# Patient Record
Sex: Male | Born: 1975 | Race: Black or African American | Hispanic: No | Marital: Married | Smoking: Never smoker
Health system: Southern US, Community
[De-identification: ages and names within clinical notes are randomized; demographics above are authoritative.]

---

## 2008-06-05 ENCOUNTER — Emergency Department (HOSPITAL_COMMUNITY): Admission: EM | Admit: 2008-06-05 | Discharge: 2008-06-05 | Payer: Self-pay | Admitting: Emergency Medicine

## 2010-10-12 ENCOUNTER — Other Ambulatory Visit: Payer: Self-pay | Admitting: Internal Medicine

## 2010-10-12 DIAGNOSIS — R1010 Upper abdominal pain, unspecified: Secondary | ICD-10-CM

## 2010-10-13 ENCOUNTER — Ambulatory Visit
Admission: RE | Admit: 2010-10-13 | Discharge: 2010-10-13 | Disposition: A | Discharge status: Home / Self Care | Payer: Managed Care, Other (non HMO) | Source: Ambulatory Visit | Attending: Internal Medicine | Admitting: Internal Medicine

## 2010-10-13 ENCOUNTER — Other Ambulatory Visit: Payer: Self-pay | Admitting: Internal Medicine

## 2010-10-13 DIAGNOSIS — R1010 Upper abdominal pain, unspecified: Secondary | ICD-10-CM

## 2012-06-24 IMAGING — RF DG UGI W/ HIGH DENSITY W/KUB
19 of 24 series · 19 of 24 positions shown · non-contrast
Comparison: None.

CLINICAL DATA: Upper abdominal pain.  Question gastroesophageal
reflux disease/ulcer.

UPPER GI SERIES WITH KUB
TECHNIQUE: Routine upper GI series was performed with thin and
high density barium.
Fluoroscopy Time: 2.5 minutes

[Series 1: run · 1 of 2 slices shown (1 of 19)]
[im 1/2]
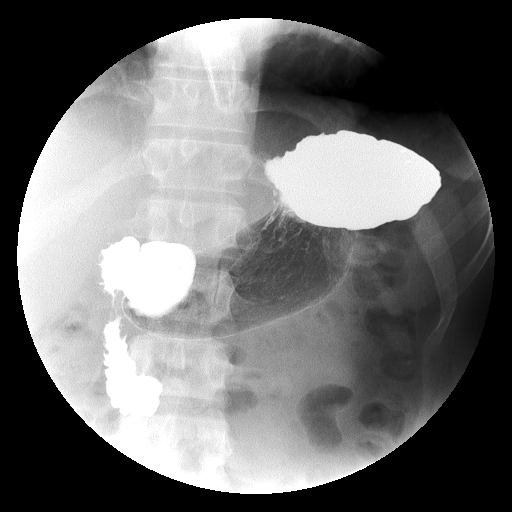

[Series 2: run · 1 of 2 slices shown (2 of 19)]
[im 1/2]
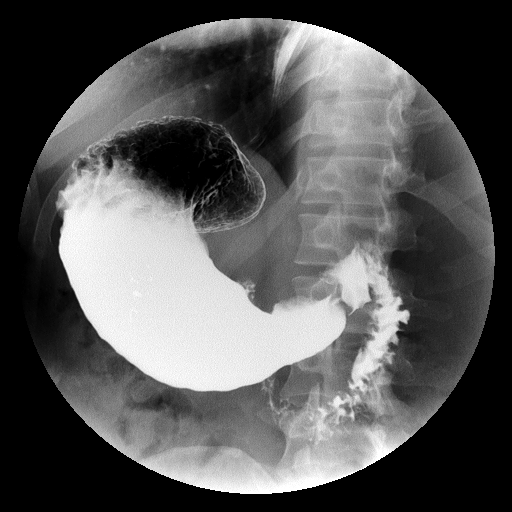

[Series 4: run · 1 of 4 slices shown (3 of 19)]
[im 1/4]
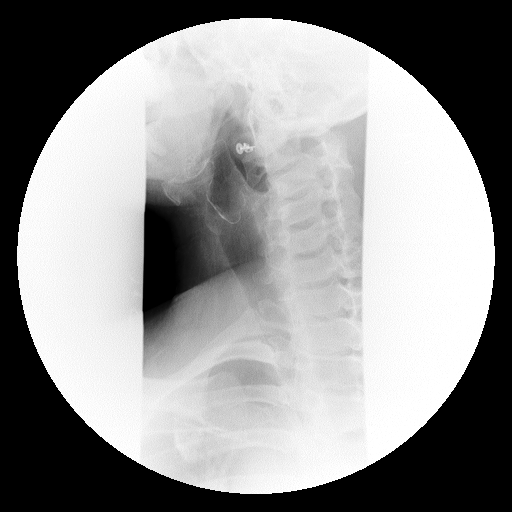

[Series 5: run · 1 of 6 slices shown (4 of 19)]
[im 1/6]
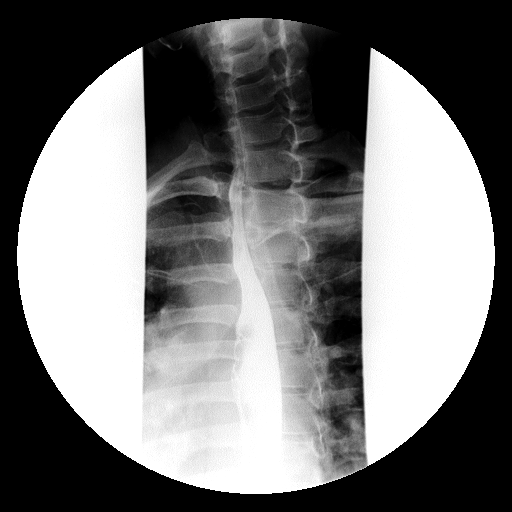

[Series 6: run · 1 of 7 slices shown (5 of 19)]
[im 1/7]
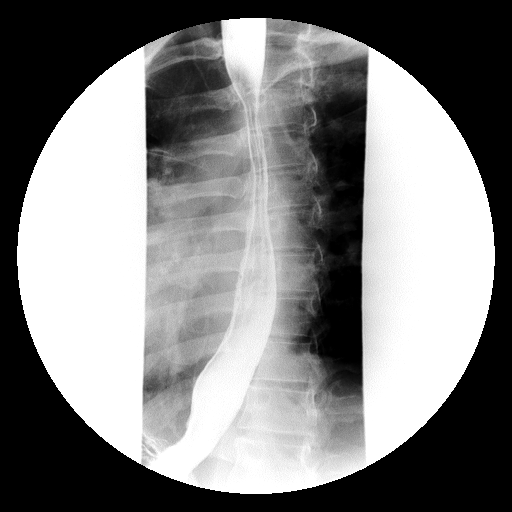

[Series 7: run · 1 of 10 slices shown (6 of 19)]
[im 1/10]
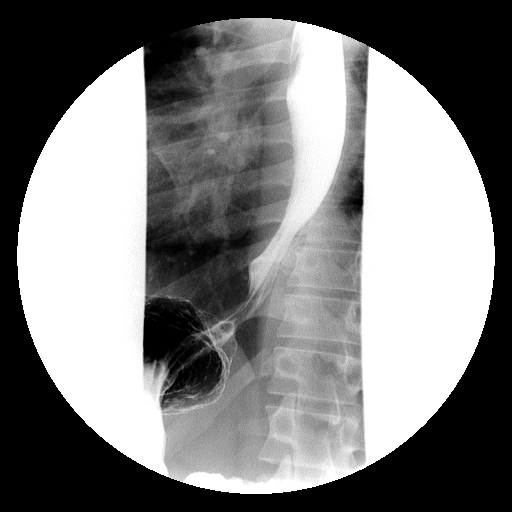

[Series 9: run · 1 of 1 slices shown (7 of 19)]
[im 1/1]
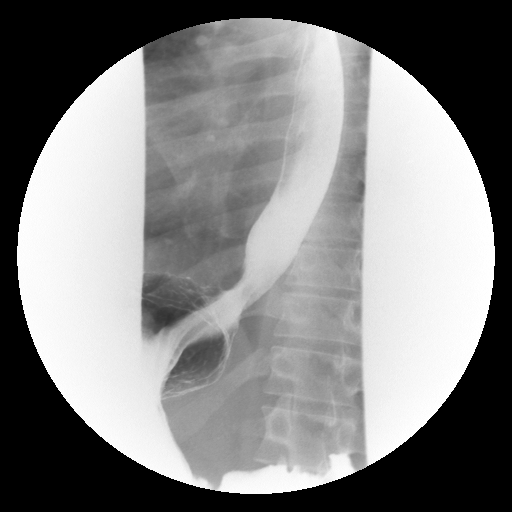

[Series 10: run · 1 of 1 slices shown (8 of 19)]
[im 1/1]
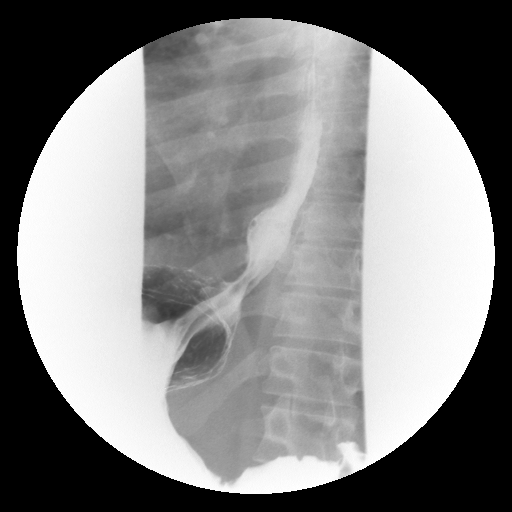

[Series 11: run · 1 of 1 slices shown (9 of 19)]
[im 1/1]
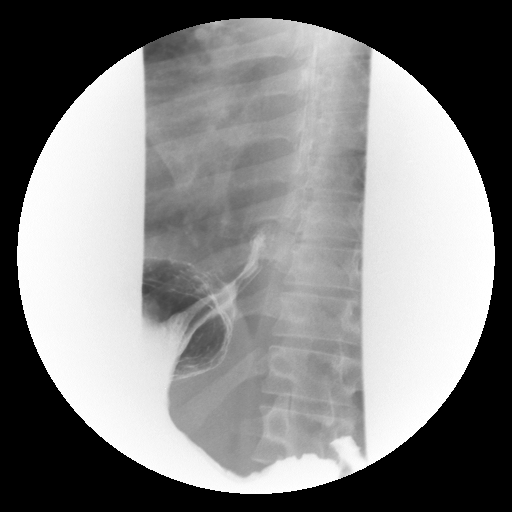

[Series 13: run · 1 of 1 slices shown (10 of 19)]
[im 1/1]
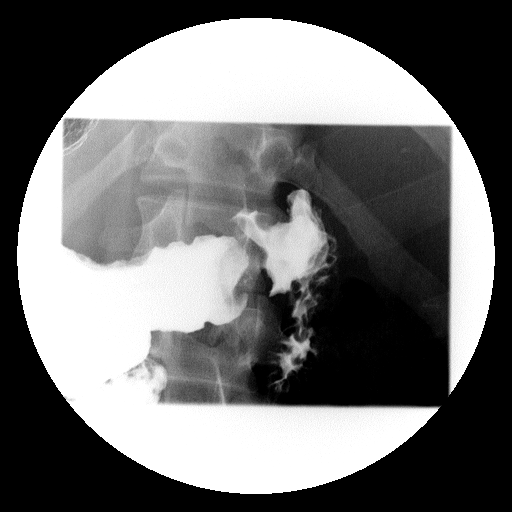

[Series 14: run · 1 of 1 slices shown (11 of 19)]
[im 1/1]
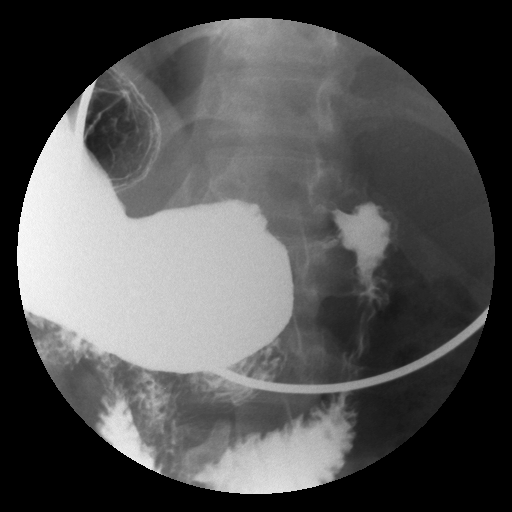

[Series 15: run · 1 of 1 slices shown (12 of 19)]
[im 1/1]
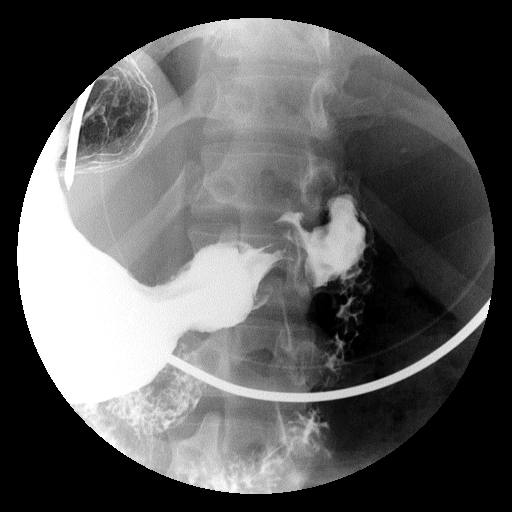

[Series 16: run · 1 of 1 slices shown (13 of 19)]
[im 1/1]
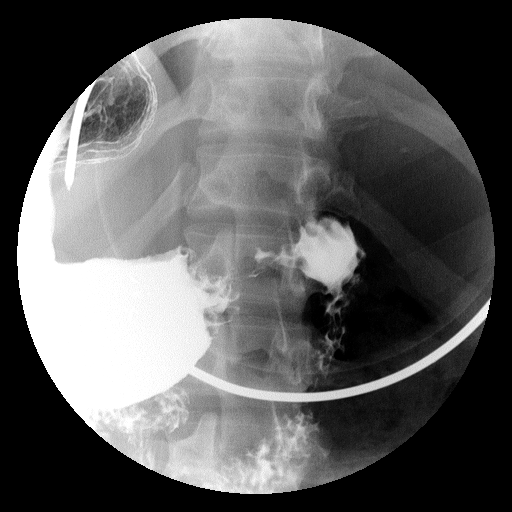

[Series 18: run · 1 of 1 slices shown (14 of 19)]
[im 1/1]
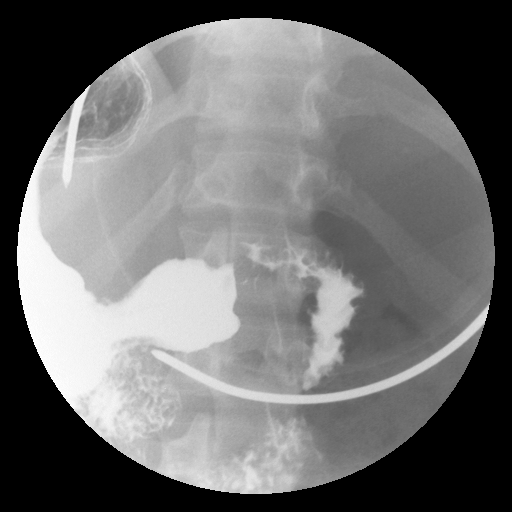

[Series 19: run · 1 of 1 slices shown (15 of 19)]
[im 1/1]
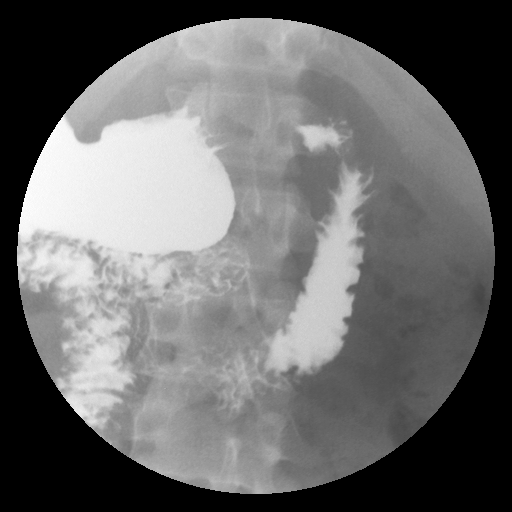

[Series 20: run · 1 of 1 slices shown (16 of 19)]
[im 1/1]
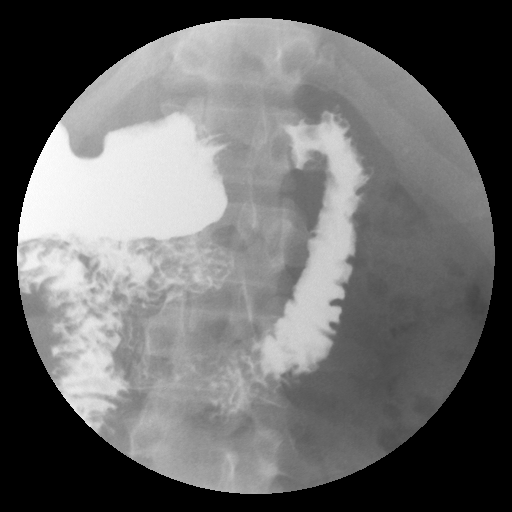

[Series 21: run · 1 of 1 slices shown (17 of 19)]
[im 1/1]
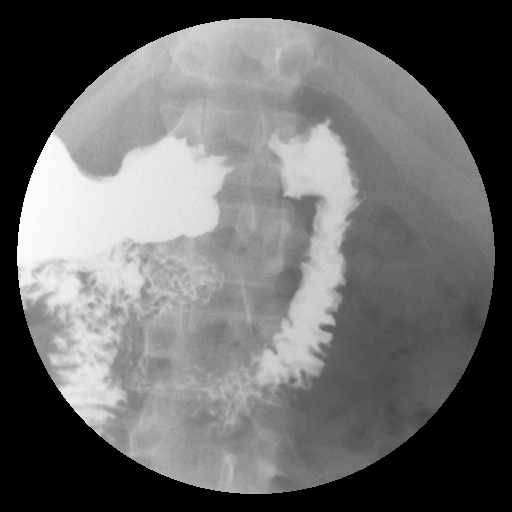

[Series 23: run · 1 of 1 slices shown (18 of 19)]
[im 1/1]
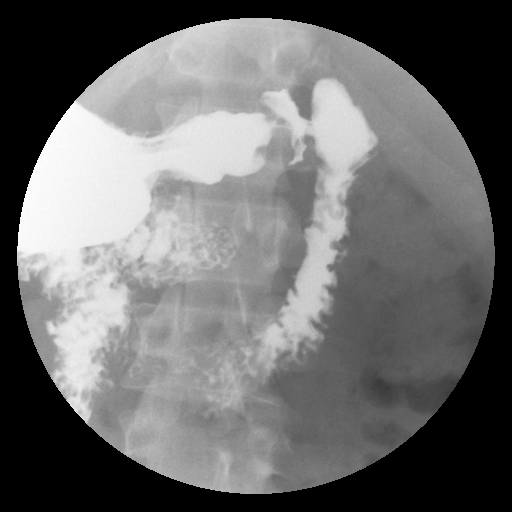

[Series 24: run · 1 of 1 slices shown (19 of 19)]
[im 1/1]
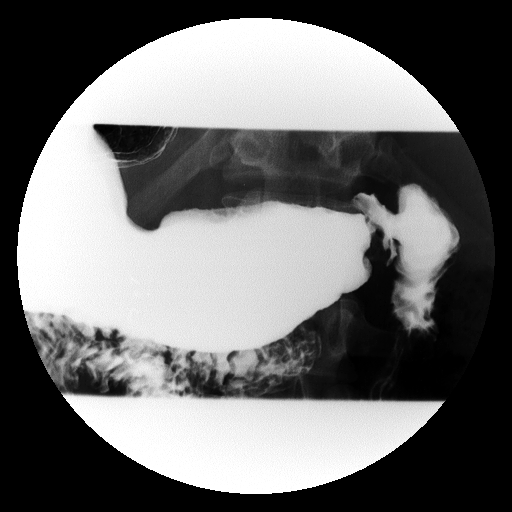

[19 of 24 positions shown; findings below may reference images not displayed]

FINDINGS: Scout film demonstrates normal bowel gas pattern with no
significant fecal burden, abnormal calcification or visceromegaly.

Normal antegrade peristalsis through cervical and thoracic
esophagus with no intrinsic or extrinsic esophageal lesion.

No spontaneous or induced (Valsalva/water siphon) gastroesophageal
reflux demonstrated.

Gastric antral mucosa with slightly prominent areae gastricae with
no ulcer/mass.

Duodenal bulb apex shows thickened folds and spasticity of
duodenitis with no ulcer visualized.

Stomach, duodenum, and proximal loops of jejunum are otherwise
normal-appearing with prompt egress of barium through structures.
IMPRESSION: 1.  Gastric antral slightly prominent areae gastricae consistent
with hypersecretory  gastropathy versus normal variant.
2.  Duodenal bulb apex duodenitis without ulcer.
3.  Otherwise, normal.

## 2015-09-14 ENCOUNTER — Other Ambulatory Visit: Payer: Self-pay | Admitting: Internal Medicine

## 2015-09-14 DIAGNOSIS — N289 Disorder of kidney and ureter, unspecified: Secondary | ICD-10-CM

## 2015-09-15 ENCOUNTER — Ambulatory Visit
Admission: RE | Admit: 2015-09-15 | Discharge: 2015-09-15 | Disposition: A | Discharge status: Home / Self Care | Payer: Managed Care, Other (non HMO) | Source: Ambulatory Visit | Attending: Internal Medicine | Admitting: Internal Medicine

## 2015-09-15 DIAGNOSIS — N289 Disorder of kidney and ureter, unspecified: Secondary | ICD-10-CM

## 2016-07-06 ENCOUNTER — Encounter (HOSPITAL_COMMUNITY): Payer: Self-pay | Admitting: *Deleted

## 2016-07-06 ENCOUNTER — Other Ambulatory Visit: Payer: Self-pay

## 2016-07-06 ENCOUNTER — Emergency Department (HOSPITAL_COMMUNITY)
Admission: EM | Admit: 2016-07-06 | Discharge: 2016-07-06 | Disposition: A | Discharge status: Home / Self Care | Payer: Commercial Managed Care - PPO | Attending: Emergency Medicine | Admitting: Emergency Medicine

## 2016-07-06 DIAGNOSIS — R7989 Other specified abnormal findings of blood chemistry: Secondary | ICD-10-CM | POA: Insufficient documentation

## 2016-07-06 DIAGNOSIS — I10 Essential (primary) hypertension: Secondary | ICD-10-CM

## 2016-07-06 LAB — CBC
HCT: 46.6 % (ref 39.0–52.0)
Hemoglobin: 15.4 g/dL (ref 13.0–17.0)
MCH: 29.9 pg (ref 26.0–34.0)
MCHC: 33 g/dL (ref 30.0–36.0)
MCV: 90.5 fL (ref 78.0–100.0)
PLATELETS: 223 10*3/uL (ref 150–400)
RBC: 5.15 MIL/uL (ref 4.22–5.81)
RDW: 14.1 % (ref 11.5–15.5)
WBC: 6.7 10*3/uL (ref 4.0–10.5)

## 2016-07-06 LAB — URINALYSIS, COMPLETE (UACMP) WITH MICROSCOPIC
Bacteria, UA: NONE SEEN
Bilirubin Urine: NEGATIVE
GLUCOSE, UA: NEGATIVE mg/dL
Hgb urine dipstick: NEGATIVE
KETONES UR: NEGATIVE mg/dL
Leukocytes, UA: NEGATIVE
Nitrite: NEGATIVE
PROTEIN: 30 mg/dL — AB
SQUAMOUS EPITHELIAL / LPF: NONE SEEN
Specific Gravity, Urine: 1.012 (ref 1.005–1.030)
pH: 8 (ref 5.0–8.0)

## 2016-07-06 LAB — I-STAT TROPONIN, ED: Troponin i, poc: 0.01 ng/mL (ref 0.00–0.08)

## 2016-07-06 LAB — BASIC METABOLIC PANEL
Anion gap: 11 (ref 5–15)
BUN: 17 mg/dL (ref 6–20)
CHLORIDE: 104 mmol/L (ref 101–111)
CO2: 24 mmol/L (ref 22–32)
CREATININE: 1.69 mg/dL — AB (ref 0.61–1.24)
Calcium: 9.3 mg/dL (ref 8.9–10.3)
GFR calc Af Amer: 57 mL/min — ABNORMAL LOW (ref >60)
GFR, EST NON AFRICAN AMERICAN: 49 mL/min — AB (ref >60)
GLUCOSE: 99 mg/dL (ref 65–99)
Potassium: 4 mmol/L (ref 3.5–5.1)
SODIUM: 139 mmol/L (ref 135–145)

## 2016-07-06 MED ORDER — CLONIDINE HCL 0.2 MG PO TABS
0.2000 mg | ORAL_TABLET | Freq: Once | ORAL | Status: AC
  Administered 2016-07-06: 0.2 mg via ORAL
  Filled 2016-07-06: qty 1

## 2016-07-06 MED ORDER — AMLODIPINE BESYLATE 5 MG PO TABS
5.0000 mg | ORAL_TABLET | Freq: Once | ORAL | Status: AC
  Administered 2016-07-06: 5 mg via ORAL
  Filled 2016-07-06: qty 1

## 2016-07-06 NOTE — ED Provider Notes (Signed)
[facility]-EMERGENCY DEPT Provider Note   CSN: [id number] Arrival date & time: 07/06/16  1150     History   Chief Complaint Chief Complaint  Patient presents with  . Hypertension    HPI Edward Savage is a 41 y.o. male.  The history is provided by the patient.  Illness  This is a new problem. Episode onset: unknown. The problem occurs constantly. The problem has not changed since onset.Pertinent negatives include no chest pain, no abdominal pain, no headaches and no shortness of breath. Nothing aggravates the symptoms. Nothing relieves the symptoms. He has tried nothing for the symptoms. The treatment provided no relief.    History reviewed. No pertinent past medical history.  There are no active problems to display for this patient.   History reviewed. No pertinent surgical history.     Home Medications    Prior to Admission medications   Medication Sig Start Date End Date Taking? Authorizing Provider  amLODipine (NORVASC) 10 MG tablet Take 10 mg by mouth daily.    [provider]    Family History History reviewed. No pertinent family history.  Social History Social History  Substance Use Topics  . Smoking status: Never Smoker  . Smokeless tobacco: Not on file  . Alcohol use Yes     Comment: occ     Allergies   Patient has no known allergies.   Review of Systems Review of Systems  Constitutional: Negative for fever.  HENT: Negative.   Eyes: Negative for photophobia and pain.  Respiratory: Negative for cough and shortness of breath.   Cardiovascular: Negative for chest pain, palpitations and leg swelling.  Gastrointestinal: Negative for abdominal pain, diarrhea, nausea and vomiting.  Genitourinary: Negative.   Musculoskeletal: Negative.   Skin: Negative.   Neurological: Negative for syncope and headaches.  All other systems reviewed and are negative.    Physical Exam Updated Vital Signs BP (!) 163/116   Pulse 65   Temp 98.5 F (36.9  C) (Oral)   Resp 13   Ht 5\' 6"  (1.676 m)   Wt 72.6 kg   SpO2 97%   BMI 25.82 kg/m   Physical Exam  Constitutional: He is oriented to person, place, and time. He appears well-developed and well-nourished. No distress.  HENT:  Head: Normocephalic and atraumatic.  Mouth/Throat: Oropharynx is clear and moist.  Eyes: Conjunctivae and EOM are normal. Pupils are equal, round, and reactive to light.  Retina normal appearing bilaterally  Neck: Normal range of motion. Neck supple.  Cardiovascular: Normal rate, regular rhythm, normal heart sounds and intact distal pulses.   No murmur heard. Pulmonary/Chest: Effort normal and breath sounds normal. No respiratory distress. He exhibits no tenderness.  Abdominal: Soft. He exhibits no distension. There is no tenderness. There is no rebound and no guarding.  Musculoskeletal: Normal range of motion. He exhibits no tenderness or deformity.  Neurological: He is alert and oriented to person, place, and time. No cranial nerve deficit. He exhibits normal muscle tone. Coordination normal.  Skin: Skin is warm and dry. No rash noted. He is not diaphoretic. No erythema. No pallor.  Psychiatric: He has a normal mood and affect.  Nursing note and vitals reviewed.    ED Treatments / Results  Labs (all labs ordered are listed, but only abnormal results are displayed) Labs Reviewed  BASIC METABOLIC PANEL - Abnormal; Notable for the following:       Result Value   Creatinine, Ser 1.69 (*)    GFR calc non  Af Amer 49 (*)    GFR calc Af Amer 57 (*)    All other components within normal limits  URINALYSIS, COMPLETE (UACMP) WITH MICROSCOPIC - Abnormal; Notable for the following:    Color, Urine STRAW (*)    Protein, ur 30 (*)    All other components within normal limits  CBC  I-STAT TROPOININ, ED    EKG  EKG Interpretation  Date/Time:  Friday Jul 06 2016 17:07:20 EDT Ventricular Rate:  83 PR Interval:    QRS Duration: 89 QT Interval:  369 QTC  Calculation: 434 R Axis:   4 Text Interpretation:  Sinus rhythm Consider left ventricular hypertrophy Anterior Q waves, possibly due to LVH Confirmed by ZAVITZ MD, Ivin Booty ([id number]) on 07/06/2016 5:19:50 PM       Radiology No results found.  Procedures Procedures (including critical care time)  Medications Ordered in ED Medications  cloNIDine (CATAPRES) tablet 0.2 mg (0.2 mg Oral Given 07/06/16 1742)  amLODipine (NORVASC) tablet 5 mg (5 mg Oral Given 07/06/16 1742)     Initial Impression / Assessment and Plan / ED Course  I have reviewed the triage vital signs and the nursing notes.  Pertinent labs & imaging results that were available during my care of the patient were reviewed by me and considered in my medical decision making (see chart for details).     Patient is a 41 year old male with no significant past medical history who presents today for concerns of hypertension that was discovered at his work physical. He otherwise denies any chest pain, shortness of breath, headache, vision changes and states that he feels completely at his baseline. Further history and exam as above notable for hypertension but otherwise hemodynamically stable. No significant findings on exam. Normal retinal exam and neuro exam. EKG obtained with signs of ventricular hypertrophy with Q waves suggestive of chronic right heart strain. Given the patient denies any chest pain or shortness of breath, I do not believe he is having cardiac end organ damage. Labs with creatinine 1.69 and some proteinuria but no baseline labs or EKG for comparison. blood pressure improved with clonidine and Norvasc here. discussed all findings extensively with patient including the risk of kidney injury, cardiac injury giving abnormal EKG, and long term implications of HTN. Discussed option of inpatient admission for further workup and blood pressure management. Patient and family do not wish to be admitted as they have good relationship with  their primary care doctor who sent him here today and will follow-up first time available. Shared decision-making was used to decide the patient should be discharged with close return precautions.  I have reviewed all labs and ekgs. Patient stable for discharge home.  I have reviewed all results with the patient. Advised to f/u on Monday with his PCP. Will rx norvasc. Patient agrees to stated plan. All questions answered. Advised to call or return to have any questions, new symptoms, change in symptoms, or symptoms that they do not understand.   Final Clinical Impressions(s) / EtheD Diagnoses   Final diagnoses:  Hypertension, unspecified type  Elevated serum creatinine    New Prescriptions Discharge Medication List as of 07/06/2016  6:56 PM       Marijean Niemann, MD 07/07/16 1322    Blane Ohara, MD 07/07/16 1505

## 2016-07-06 NOTE — ED Notes (Signed)
Updated pt. On plan of care.  Pt. Verbalized understanding.  Pt. Denies any pain or discomfort.  Gait steady.  Denies any dizziness  .  Alert and oriented X4.

## 2016-07-06 NOTE — Discharge Instructions (Signed)
Follow-up very closely with your primary doctor.. Return to the emergency department if you have chest pain, shortness of breath, stroke symptoms or other new concerns. Please take her blood pressure meds as prescribed.

## 2016-07-06 NOTE — ED Notes (Signed)
Pt verbalized understanding discharge instructions and denies any further needs or questions at this time. VS stable, ambulatory and steady gait.   

## 2016-07-17 ENCOUNTER — Other Ambulatory Visit: Payer: Self-pay | Admitting: Internal Medicine

## 2016-07-17 DIAGNOSIS — N183 Chronic kidney disease, stage 3 unspecified: Secondary | ICD-10-CM

## 2016-08-14 ENCOUNTER — Other Ambulatory Visit: Payer: Managed Care, Other (non HMO)

## 2019-02-16 ENCOUNTER — Other Ambulatory Visit: Payer: Self-pay | Admitting: Nephrology

## 2019-02-16 DIAGNOSIS — N183 Chronic kidney disease, stage 3 unspecified: Secondary | ICD-10-CM

## 2019-03-02 ENCOUNTER — Ambulatory Visit
Admission: RE | Admit: 2019-03-02 | Discharge: 2019-03-02 | Disposition: A | Discharge status: Home / Self Care | Payer: Commercial Managed Care - PPO | Source: Ambulatory Visit | Attending: Nephrology | Admitting: Nephrology

## 2019-03-02 DIAGNOSIS — N183 Chronic kidney disease, stage 3 unspecified: Secondary | ICD-10-CM

## 2020-11-11 IMAGING — US US RENAL
1 series · 14 of 25 positions shown · non-contrast
Comparison: 09/15/2015

CLINICAL DATA: Chronic kidney disease stage 3

EXAM:
RENAL / URINARY TRACT ULTRASOUND COMPLETE

[Series 1: us renal · 0.23mm/px · 14 of 36 slices shown]
[im 1/36]
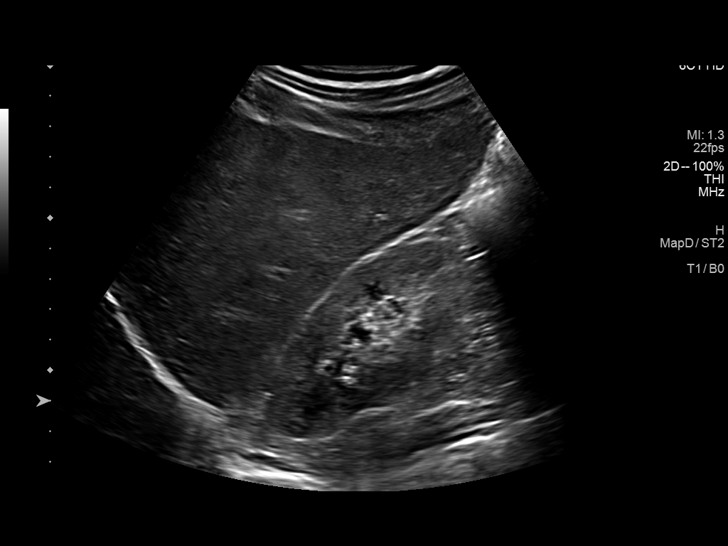
[im 3/36]
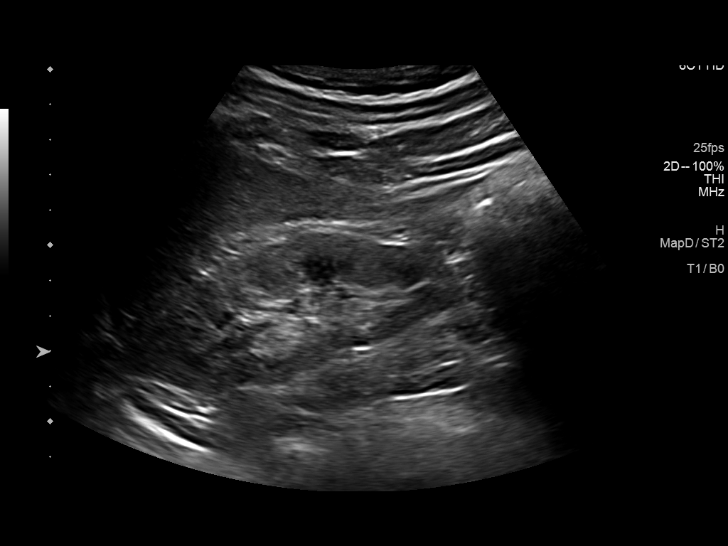
[im 6/36]
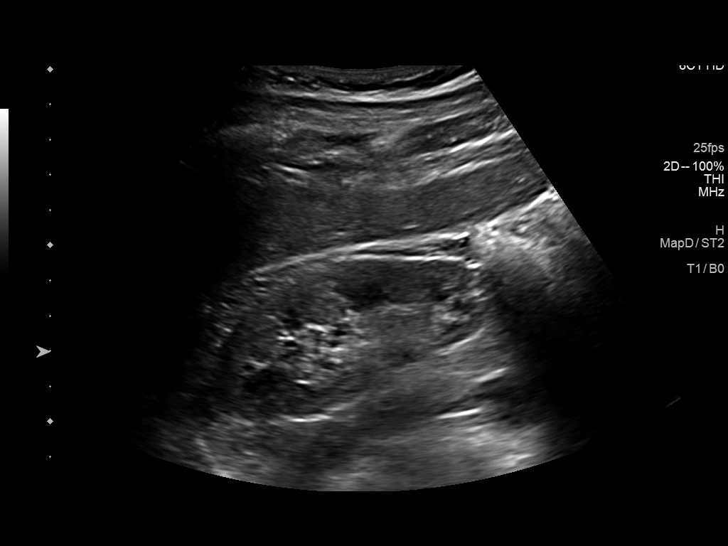
[im 9/36]
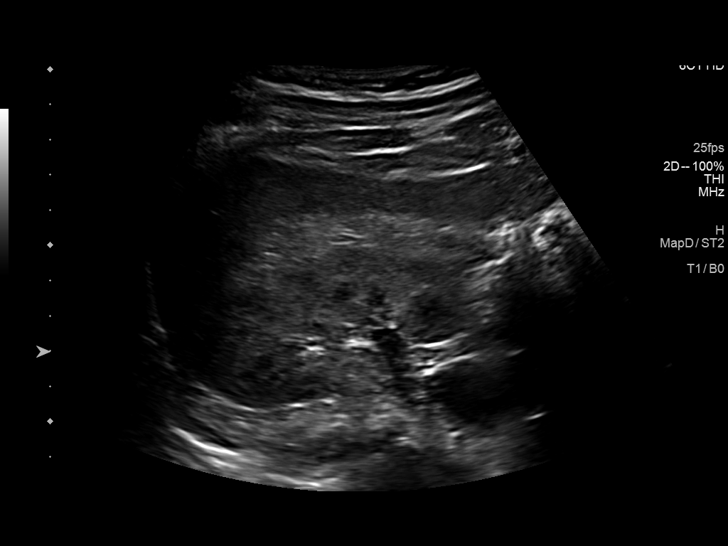
[im 12/36]
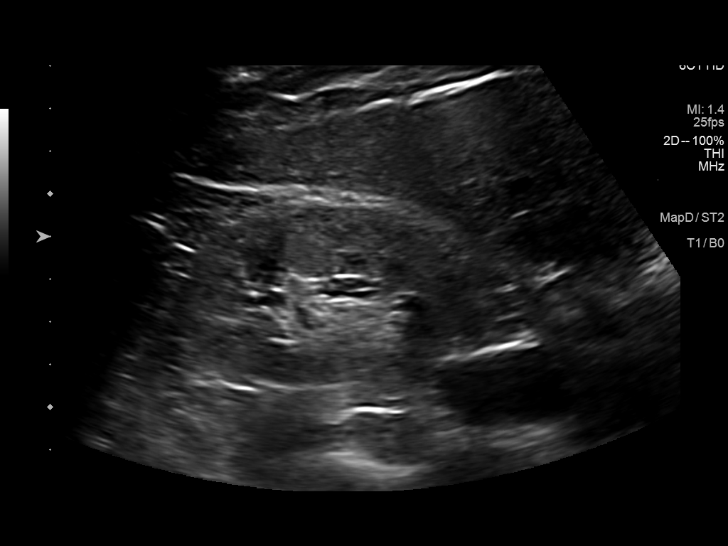
[im 14/36]
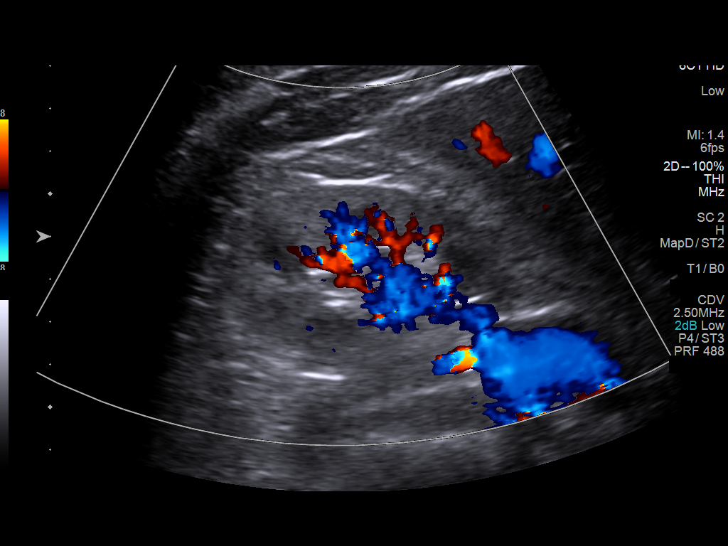
[im 17/36]
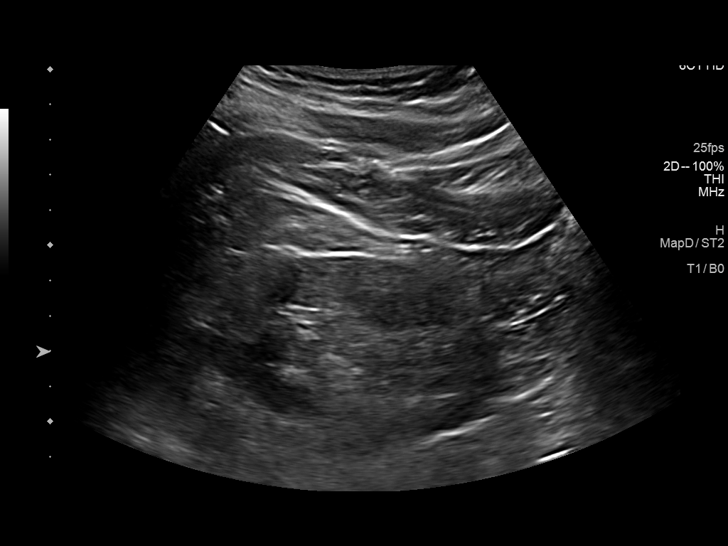
[im 19/36]
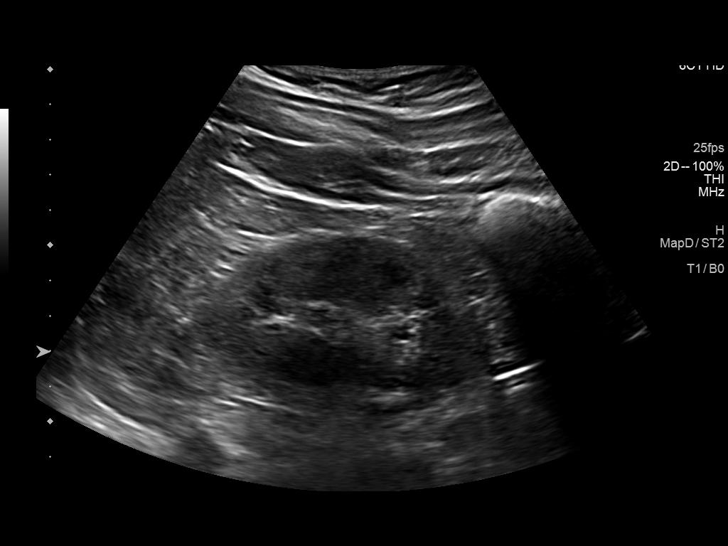
[im 22/36]
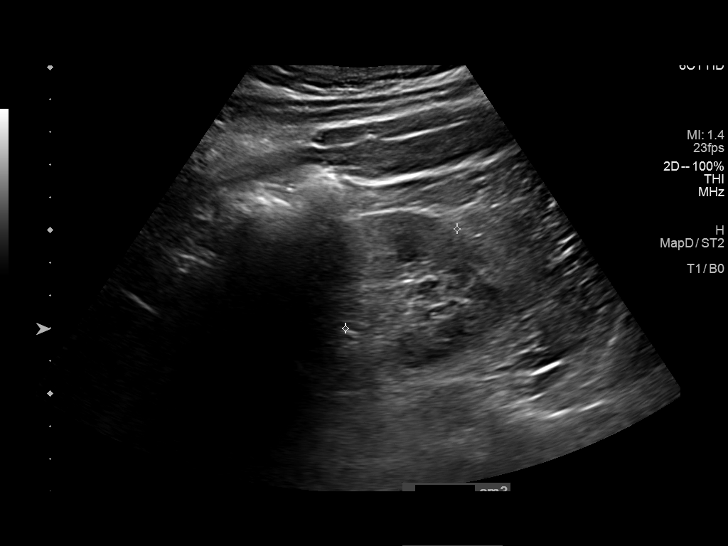
[im 24/36]
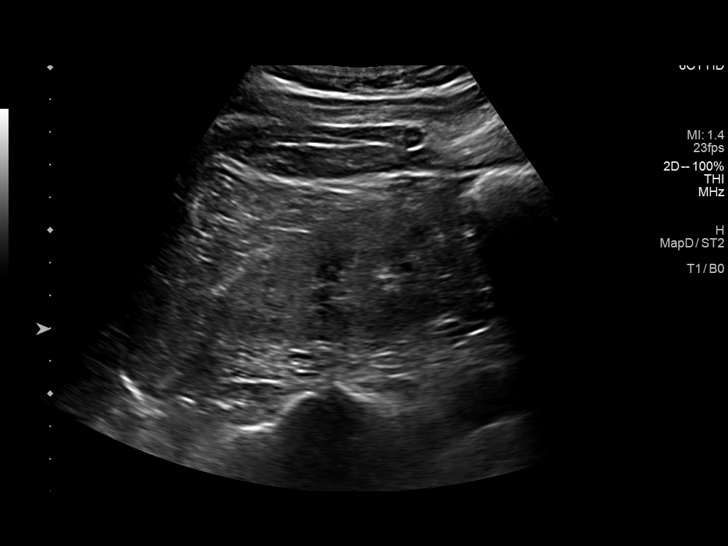
[im 27/36]
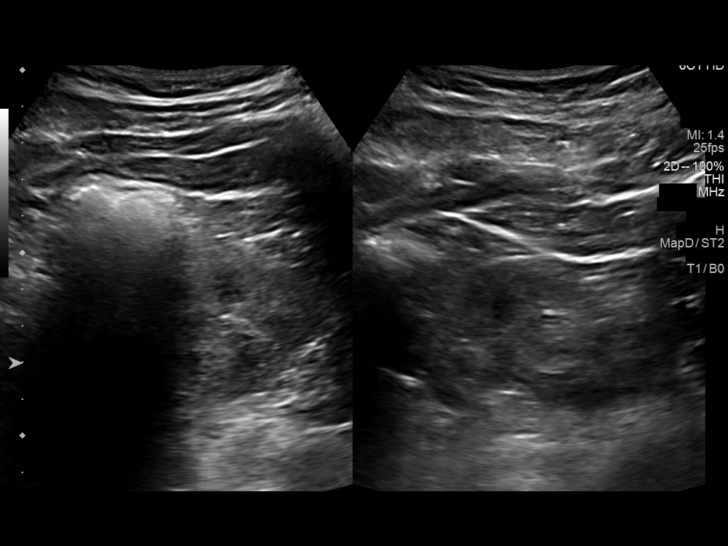
[im 30/36]
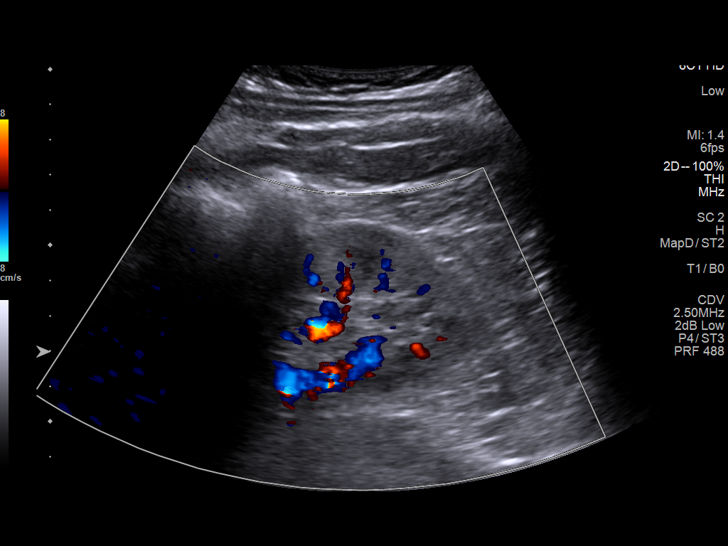
[im 33/36]
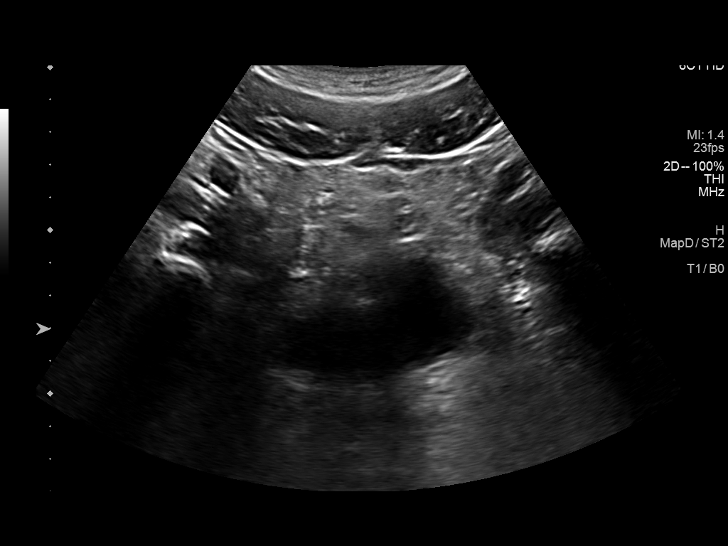
[im 36/36]
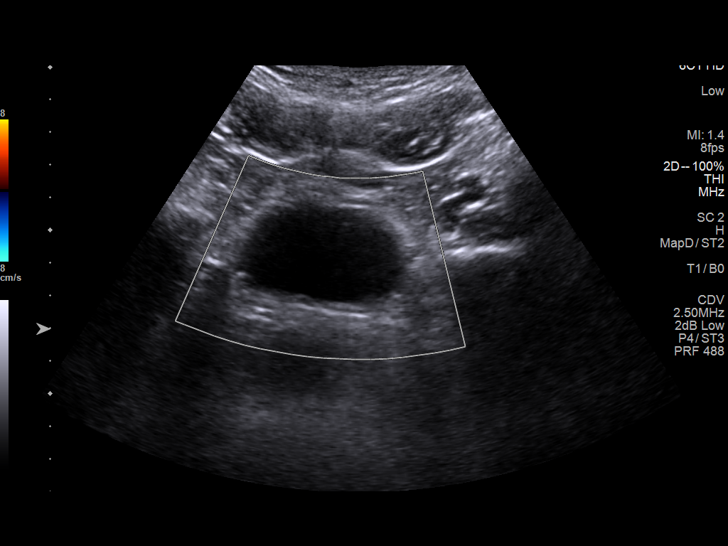

[14 of 25 positions shown; findings below may reference images not displayed]

FINDINGS: Right Kidney:

Renal measurements: 9.4 x 3.7 x 5.0 cm = volume: 91 mL. Echogenic
renal parenchyma. No mass or hydronephrosis.

Left Kidney:

Renal measurements: 9.3 x 4.6 x 4.6 cm = volume: 102 mL. Echogenic
renal parenchyma. No mass or hydronephrosis.

Bladder:

Underdistended but unremarkable.

Other:

None.
IMPRESSION: Echogenic renal parenchyma, suggesting medical renal disease.

No hydronephrosis.
